# Patient Record
Sex: Female | Born: 1960 | Race: White | Hispanic: No | Marital: Married | Smoking: Current some day smoker
Health system: Southern US, Community
[De-identification: ages and names within clinical notes are randomized; demographics above are authoritative.]

## PROBLEM LIST (undated history)

## (undated) DIAGNOSIS — F32A Depression, unspecified: Secondary | ICD-10-CM

## (undated) DIAGNOSIS — K219 Gastro-esophageal reflux disease without esophagitis: Secondary | ICD-10-CM

## (undated) DIAGNOSIS — F329 Major depressive disorder, single episode, unspecified: Secondary | ICD-10-CM

## (undated) DIAGNOSIS — I10 Essential (primary) hypertension: Secondary | ICD-10-CM

## (undated) HISTORY — PX: NOSE SURGERY: SHX723

## (undated) HISTORY — PX: CHOLECYSTECTOMY: SHX55

## (undated) HISTORY — PX: FOOT SURGERY: SHX648

## (undated) HISTORY — PX: ABDOMINAL HYSTERECTOMY: SHX81

## (undated) HISTORY — PX: WRIST SURGERY: SHX841

---

## 2017-07-22 ENCOUNTER — Encounter (HOSPITAL_BASED_OUTPATIENT_CLINIC_OR_DEPARTMENT_OTHER): Payer: Self-pay | Admitting: Emergency Medicine

## 2017-07-22 ENCOUNTER — Other Ambulatory Visit: Payer: Self-pay

## 2017-07-22 ENCOUNTER — Emergency Department (HOSPITAL_BASED_OUTPATIENT_CLINIC_OR_DEPARTMENT_OTHER)
Admission: EM | Admit: 2017-07-22 | Discharge: 2017-07-22 | Disposition: A | Discharge status: Home / Self Care | Payer: BC Managed Care – PPO | Attending: Emergency Medicine | Admitting: Emergency Medicine

## 2017-07-22 DIAGNOSIS — I10 Essential (primary) hypertension: Secondary | ICD-10-CM | POA: Insufficient documentation

## 2017-07-22 DIAGNOSIS — W01198A Fall on same level from slipping, tripping and stumbling with subsequent striking against other object, initial encounter: Secondary | ICD-10-CM | POA: Insufficient documentation

## 2017-07-22 DIAGNOSIS — S0101XA Laceration without foreign body of scalp, initial encounter: Secondary | ICD-10-CM | POA: Insufficient documentation

## 2017-07-22 DIAGNOSIS — Y929 Unspecified place or not applicable: Secondary | ICD-10-CM | POA: Insufficient documentation

## 2017-07-22 DIAGNOSIS — Y939 Activity, unspecified: Secondary | ICD-10-CM | POA: Insufficient documentation

## 2017-07-22 DIAGNOSIS — Z79899 Other long term (current) drug therapy: Secondary | ICD-10-CM | POA: Insufficient documentation

## 2017-07-22 DIAGNOSIS — Z23 Encounter for immunization: Secondary | ICD-10-CM | POA: Diagnosis not present

## 2017-07-22 DIAGNOSIS — F17200 Nicotine dependence, unspecified, uncomplicated: Secondary | ICD-10-CM | POA: Insufficient documentation

## 2017-07-22 DIAGNOSIS — S0990XA Unspecified injury of head, initial encounter: Secondary | ICD-10-CM | POA: Diagnosis present

## 2017-07-22 DIAGNOSIS — Y999 Unspecified external cause status: Secondary | ICD-10-CM | POA: Insufficient documentation

## 2017-07-22 HISTORY — DX: Depression, unspecified: F32.A

## 2017-07-22 HISTORY — DX: Gastro-esophageal reflux disease without esophagitis: K21.9

## 2017-07-22 HISTORY — DX: Essential (primary) hypertension: I10

## 2017-07-22 HISTORY — DX: Major depressive disorder, single episode, unspecified: F32.9

## 2017-07-22 MED ORDER — TETANUS-DIPHTH-ACELL PERTUSSIS 5-2.5-18.5 LF-MCG/0.5 IM SUSP
0.5000 mL | Freq: Once | INTRAMUSCULAR | Status: AC
  Administered 2017-07-22: 0.5 mL via INTRAMUSCULAR
  Filled 2017-07-22: qty 0.5

## 2017-07-22 MED ORDER — LIDOCAINE-EPINEPHRINE (PF) 2 %-1:200000 IJ SOLN
5.0000 mL | Freq: Once | INTRAMUSCULAR | Status: AC
  Administered 2017-07-22: 5 mL
  Filled 2017-07-22: qty 10

## 2017-07-22 NOTE — ED Triage Notes (Signed)
Patient states that she was splitting wood and hit her head on a piece of wood when she tripped  - left sided scalp wood

## 2017-07-22 NOTE — ED Provider Notes (Signed)
[facility] EMERGENCY DEPARTMENT Provider Note   CSN: [id number] Arrival date & time: 07/22/17  1737     History   Chief Complaint Chief Complaint  Patient presents with  . Head Injury    HPI Nichole OsmondSherry Montecalvo is a 57 y.o. female presenting to the ED with laceration to left scalp that occurred prior to arrival.  She states she was splitting wood with her husband, and tripped over a piece of wood falling and hitting her head on the metal wood splitter.  She states she has a laceration to the left scalp, which she had difficulty with bleeding initially.  She reports very minimal pain to her scalp in that area, however no other complaints.  Denies LOC.  Denies headache, vision changes, nausea, neck or back pain, or any complaints.  Is not on anticoagulation.  The history is provided by the patient.    Past Medical History:  Diagnosis Date  . Depression   . GERD (gastroesophageal reflux disease)   . Hypertension     There are no active problems to display for this patient.   Past Surgical History:  Procedure Laterality Date  . ABDOMINAL HYSTERECTOMY    . CHOLECYSTECTOMY    . FOOT SURGERY    . NOSE SURGERY    . WRIST SURGERY       OB History   None      Home Medications    Prior to Admission medications   Medication Sig Start Date End Date Taking? Authorizing Provider  clonazePAM (KLONOPIN) 0.5 MG tablet Take 0.5 mg by mouth 2 (two) times daily as needed for anxiety.   Yes [provider]  DULoxetine (CYMBALTA) 60 MG capsule Take 60 mg by mouth daily.   Yes [provider]  esomeprazole (NEXIUM) 40 MG capsule Take 40 mg by mouth daily at 12 noon.   Yes [provider]  losartan-hydrochlorothiazide (HYZAAR) 100-25 MG tablet Take 1 tablet by mouth daily.   Yes [provider]    Family History History reviewed. No pertinent family history.  Social History Social History   Tobacco Use  . Smoking status: Current Some  Day Smoker  . Smokeless tobacco: Never Used  Substance Use Topics  . Alcohol use: Never    Frequency: Never  . Drug use: Never     Allergies   Sulfa antibiotics   Review of Systems Review of Systems  Eyes: Negative for visual disturbance.  Musculoskeletal: Negative for back pain and neck pain.  Skin: Positive for wound.  Neurological: Negative for syncope and headaches.  Hematological: Does not bruise/bleed easily.  All other systems reviewed and are negative.    Physical Exam Updated Vital Signs BP 140/66 (BP Location: Left Arm)   Pulse 89   Temp 98.6 F (37 C) (Oral)   Resp 18   Ht 5\' 4"  (1.626 m)   Wt 81.6 kg (180 lb)   SpO2 98%   BMI 30.90 kg/m   Physical Exam  Constitutional: She is oriented to person, place, and time. She appears well-developed and well-nourished.  Well-appearing.  HENT:  Head: Normocephalic and atraumatic.  3 cm laceration to left parietal scalp.  Not actively bleeding.  No significant hematoma, no crepitus or significant tenderness.  Not grossly contaminated.  Eyes: Conjunctivae are normal.  Neck: Normal range of motion.  Cardiovascular: Normal rate and intact distal pulses.  Pulmonary/Chest: Effort normal.  Abdominal: Soft.  Musculoskeletal:  No spinal or paraspinal tenderness.  Neurological: She is alert  and oriented to person, place, and time.  Mental Status:  Alert, oriented, thought content appropriate, able to give a coherent history. Speech fluent without evidence of aphasia. Able to follow 2 step commands without difficulty.  Cranial Nerves:  II:  Peripheral visual fields grossly normal, pupils equal, round, reactive to light III,IV, VI: ptosis not present, extra-ocular motions intact bilaterally  V,VII: smile symmetric, facial light touch sensation equal VIII: hearing grossly normal to voice  X: uvula elevates symmetrically  XI: bilateral shoulder shrug symmetric and strong XII: midline tongue extension without  fassiculations Motor:  Normal tone. 5/5 in upper and lower extremities bilaterally including strong and equal grip strength and dorsiflexion/plantar flexion Sensory: Pinprick and light touch normal in all extremities.  Deep Tendon Reflexes: 2+ and symmetric in the biceps and patella Cerebellar: normal finger-to-nose with bilateral upper extremities Gait: normal gait and balance CV: distal pulses palpable throughout     Skin: Skin is warm.  Psychiatric: She has a normal mood and affect. Her behavior is normal.  Nursing note and vitals reviewed.    ED Treatments / Results  Labs (all labs ordered are listed, but only abnormal results are displayed) Labs Reviewed - No data to display  EKG None  Radiology No results found.  Procedures .Marland KitchenLaceration Repair Date/Time: 07/22/2017 7:48 PM Performed by: Mikena Masoner, [place] N, PA-C Authorized by: Quamesha Mullet, [place] N, PA-C   Consent:    Consent obtained:  Verbal   Consent given by:  Patient   Risks discussed:  Pain and infection   Alternatives discussed:  No treatment Anesthesia (see MAR for exact dosages):    Anesthesia method:  Local infiltration   Local anesthetic:  Lidocaine 1% WITH epi Laceration details:    Location:  Scalp   Scalp location:  L parietal   Length (cm):  3   Depth (mm):  5 Repair type:    Repair type:  Simple Pre-procedure details:    Preparation:  Patient was prepped and draped in usual sterile fashion Exploration:    Hemostasis achieved with:  Direct pressure   Wound exploration: entire depth of wound probed and visualized     Wound extent: no foreign bodies/material noted, no tendon damage noted and no underlying fracture noted     Contaminated: no   Treatment:    Area cleansed with:  Saline   Amount of cleaning:  Extensive   Irrigation solution:  Sterile saline   Irrigation method:  Syringe   Visualized foreign bodies/material removed: no   Skin repair:    Repair method:  Staples   Number of  staples:  6 Approximation:    Approximation:  Close Post-procedure details:    Dressing:  Open (no dressing)   Patient tolerance of procedure:  Tolerated well, no immediate complications   (including critical care time)  Medications Ordered in ED Medications  lidocaine-EPINEPHrine (XYLOCAINE W/EPI) 2 %-1:200000 (PF) injection 5 mL (has no administration in time range)  Tdap (BOOSTRIX) injection 0.5 mL (has no administration in time range)     Initial Impression / Assessment and Plan / ED Course  I have reviewed the triage vital signs and the nursing notes.  Pertinent labs & imaging results that were available during my care of the patient were reviewed by me and considered in my medical decision making (see chart for details).     Pt with scalp laceration after mechanical fall. No LOC. No HA, vision changes, nausea, or signs of serious head injury. Pt without complaints in ED.  Normal neurologic exam. NO evidence of skull fracture. Pressure irrigation performed. Wound explored and base of wound visualized in a bloodless field without evidence of foreign body.  Staples placed for closure. Tdap updated.  Pt has no comorbidities to effect normal wound healing. Pt discharged without antibiotics.  Discussed stapled home care with patient and answered questions. Pt to follow-up for wound check and staple removal in 10 days; they are to return to the ED sooner for signs of infection. Pt is hemodynamically stable with no complaints prior to dc.   Discussed results, findings, treatment and follow up. Patient advised of return precautions. Patient verbalized understanding and agreed with plan.  Final Clinical Impressions(s) / ED Diagnoses   Final diagnoses:  Scalp laceration, initial encounter    ED Discharge Orders    None       Briona Korpela, [place] N, PA-C 07/22/17 1949    Little, Ambrose [place], MD 07/23/17 [phone]

## 2017-07-22 NOTE — Discharge Instructions (Addendum)
Please read instructions below.  Keep your wound clean. Starting tomorrow, you can wash it gently with soap and water.  Follow up with your primary care or urgent care for wound recheck and staple removal in 10 days.  Return to the ER for severe headache, vision changes, vomiting, fever, pus draining from wound, redness, or new or worsening symptoms.

## 2019-12-11 ENCOUNTER — Other Ambulatory Visit: Payer: Self-pay | Admitting: Physician Assistant

## 2019-12-11 DIAGNOSIS — G8929 Other chronic pain: Secondary | ICD-10-CM

## 2019-12-15 ENCOUNTER — Other Ambulatory Visit: Payer: Self-pay

## 2019-12-15 ENCOUNTER — Other Ambulatory Visit: Payer: Self-pay | Admitting: Physician Assistant

## 2019-12-15 ENCOUNTER — Ambulatory Visit
Admission: RE | Admit: 2019-12-15 | Discharge: 2019-12-15 | Disposition: A | Discharge status: Home / Self Care | Payer: BC Managed Care – PPO | Source: Ambulatory Visit | Attending: Physician Assistant | Admitting: Physician Assistant

## 2019-12-15 DIAGNOSIS — G8929 Other chronic pain: Secondary | ICD-10-CM

## 2019-12-15 DIAGNOSIS — M545 Low back pain, unspecified: Secondary | ICD-10-CM

## 2019-12-15 IMAGING — XA DG FACET JT INJ L OR S SPINE SINGLE LEVEL UNI
2 series · 2 of 2 positions shown · non-contrast
Comparison: none

CLINICAL DATA: Lumbosacral spondylosis without myelopathy. Chronic
bilateral low back pain, worse in the morning, improved with
activity throughout the day. Lower lumbar facet arthropathy.
Bilateral L5-S1 facet injections requested.

[Series 1: ortho standard · 1 of 1 slices shown (1 of 2)]
[im 1/1]
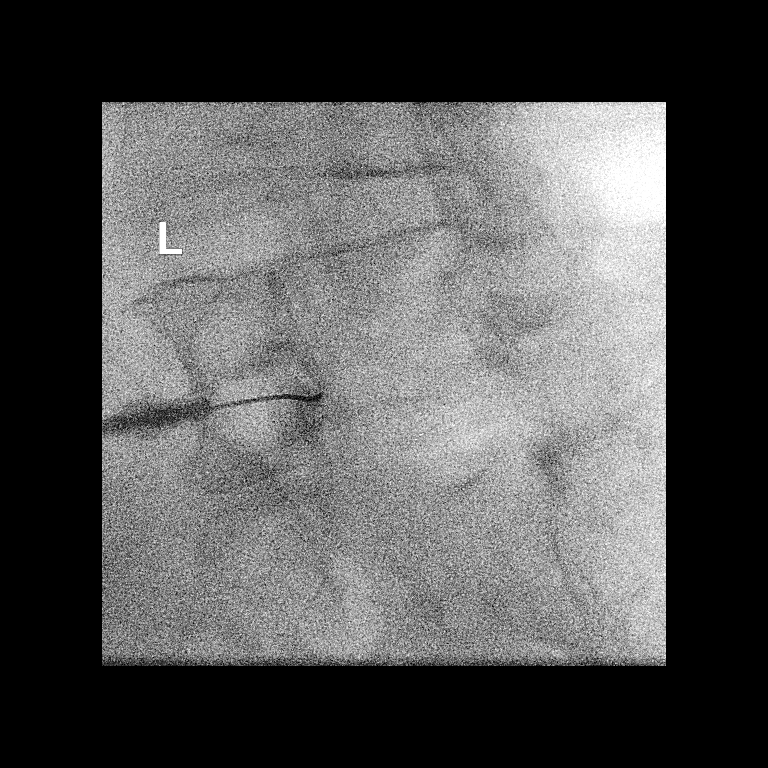

[Series 2: ortho standard · 1 of 1 slices shown (2 of 2)]
[im 1/1]
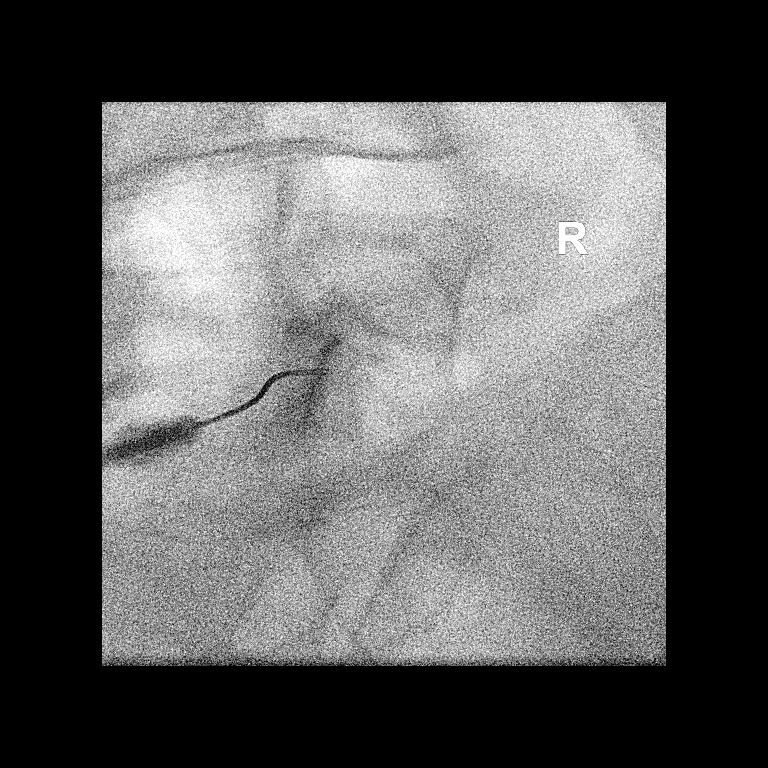

[2 of 2 positions shown; findings below may reference images not displayed]

EXAM:
DG FACET JT INJ L OR S SPINE SINGLE LEVEL UNILATERAL

FLUOROSCOPY TIME:  Radiation Exposure Index (as provided by the
fluoroscopic device): 5.4 mGy

Fluoroscopy Time:  50 seconds

Number of Acquired Images:  0

PROCEDURE:
The procedure, risks, benefits, and alternatives were explained to
the patient. Questions regarding the procedure were encouraged and
answered. The patient understands and consents to the procedure.

LEFT L5-S1 FACET INJECTION: A posterior oblique approach was taken
to the facet on the left at L5-S1 using a curved 3.5 inch 22 gauge
spinal needle. Intra-articular/juxta-articular positioning was
confirmed by injecting a small amount of Isovue-M 200. No vascular
opacification is seen. 60 mg of Depo-Medrol mixed with 0.75 mL of
0.25% bupivacaine were instilled into the joint.

RIGHT L5-S1 FACET INJECTION: A posterior oblique approach was taken
to the facet on the right at L5-S1 using a curved 3.5 inch 22 gauge
spinal needle. Intra-articular/juxta-articular positioning was
confirmed by injecting a small amount of Isovue-M 200. No vascular
opacification is seen. 60 mg of Depo-Medrol mixed with 0.75 mL of
0.25% bupivacaine were instilled into the joint.

The procedure was well-tolerated.
IMPRESSION: Technically successful bilateral L5-S1 facet injections.

## 2019-12-15 MED ORDER — DIAZEPAM 5 MG PO TABS
5.0000 mg | ORAL_TABLET | Freq: Once | ORAL | Status: AC
  Administered 2019-12-15: 5 mg via ORAL

## 2019-12-15 MED ORDER — METHYLPREDNISOLONE ACETATE 40 MG/ML INJ SUSP (RADIOLOG
120.0000 mg | Freq: Once | INTRAMUSCULAR | Status: AC
  Administered 2019-12-15: 120 mg via INTRA_ARTICULAR

## 2019-12-15 MED ORDER — IOPAMIDOL (ISOVUE-M 200) INJECTION 41%
1.0000 mL | Freq: Once | INTRAMUSCULAR | Status: AC
  Administered 2019-12-15: 1 mL via INTRA_ARTICULAR

## 2019-12-15 NOTE — Discharge Instructions (Signed)

## 2019-12-17 ENCOUNTER — Other Ambulatory Visit: Payer: BC Managed Care – PPO
